# Patient Record
Sex: Male | Born: 1995 | Hispanic: No | Marital: Single | Smoking: Never smoker
Health system: Southern US, Community
[De-identification: ages and names within clinical notes are randomized; demographics above are authoritative.]

## PROBLEM LIST (undated history)

## (undated) HISTORY — PX: NO PAST SURGERIES: SHX2092

---
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
## 2016-11-02 ENCOUNTER — Encounter: Payer: Self-pay | Admitting: Podiatry

## 2016-11-02 ENCOUNTER — Ambulatory Visit (INDEPENDENT_AMBULATORY_CARE_PROVIDER_SITE_OTHER): Payer: BLUE CROSS/BLUE SHIELD | Admitting: Podiatry

## 2016-11-02 VITALS — BP 103/72 | HR 67 | Resp 18

## 2016-11-02 DIAGNOSIS — L601 Onycholysis: Secondary | ICD-10-CM | POA: Diagnosis not present

## 2016-11-02 DIAGNOSIS — L603 Nail dystrophy: Secondary | ICD-10-CM | POA: Diagnosis not present

## 2016-11-02 DIAGNOSIS — B351 Tinea unguium: Secondary | ICD-10-CM

## 2016-11-02 NOTE — Progress Notes (Signed)
   Subjective:    Patient ID: Hector Wilson, male    DOB: 25-Apr-1995, 21 y.o.   MRN: [id number]  HPI  21 year old male presents the also with his mom for concerns of his left big toenail becoming thick and discolored. He states he play soccer in the toenail did come off in one point but as the nail came and is started to become loose as well as discolored. Denies any pain in the nail denies any surrounding redness or drainage. He's had a recent treatment for this. He has no other concerns.  Review of Systems  Skin:       Change in nails   All other systems reviewed and are negative.      Objective:   Physical Exam General: AAO x3, NAD  Dermatological: The left hallux toenails dystrophic, discolored yellow to brown discoloration is loose from the underlying nailbed distally. There is no surrounding edema, erythema, drainage or pus there is no clinical signs of infection present today. There is no open lesions. There is no significant discoloration to the other toenails.  Vascular: Dorsalis Pedis artery and Posterior Tibial artery pedal pulses are 2/4 bilateral with immedate capillary fill time. There is no pain with calf compression, swelling, warmth, erythema.   Neruologic: Grossly intact via light touch bilateral. Vibratory intact via tuning fork bilateral. Protective threshold with Semmes Wienstein monofilament intact to all pedal sites bilateral.   Musculoskeletal: No gross boney pedal deformities bilateral. No pain, crepitus, or limitation noted with foot and ankle range of motion bilateral. Muscular strength 5/5 in all groups tested bilateral.  Gait: Unassisted, Nonantalgic.     Assessment & Plan:  21 year old male with left hallux onychodystrophy likely onychomycosis -Treatment options discussed including all alternatives, risks, and complications -Etiology of symptoms were discussed -At this point discussed likely partially due to nail fungus as well as nail damage. I did  debridement toenail tenderness of this for culture and pathology symptoms to [facility] labs. If fungus we'll likely do oral Lamisil. He does live in [place] is going to school down there and I'll call him with results once I get up back. His phone number is [phone].  Ovid Curd, DPM

## 2016-11-02 NOTE — Patient Instructions (Signed)

## 2016-11-10 ENCOUNTER — Telehealth: Payer: Self-pay | Admitting: Podiatry

## 2016-11-10 NOTE — Telephone Encounter (Signed)
This patient's mother was here interpreting for another patient when she received a phone call from Marylene Landngela that her son needed to make an appointment to follow up with you. She said that her son would be unable to make an appointment since he goes to college in [place]. Is there any way his mother could receive the results and then proceed with medication for her son? Please advise.

## 2016-11-10 NOTE — Telephone Encounter (Signed)
Left vm for pt to call to schedule appt

## 2016-11-14 ENCOUNTER — Encounter: Payer: Self-pay | Admitting: Podiatry

## 2016-11-14 ENCOUNTER — Ambulatory Visit (INDEPENDENT_AMBULATORY_CARE_PROVIDER_SITE_OTHER): Payer: BLUE CROSS/BLUE SHIELD | Admitting: Podiatry

## 2016-11-14 DIAGNOSIS — Z79899 Other long term (current) drug therapy: Secondary | ICD-10-CM | POA: Diagnosis not present

## 2016-11-14 DIAGNOSIS — B351 Tinea unguium: Secondary | ICD-10-CM

## 2016-11-14 MED ORDER — TERBINAFINE HCL 250 MG PO TABS: 250.0000 mg | ORAL_TABLET | Freq: Every day | ORAL | 0 refills | Status: DC

## 2016-11-14 MED FILL — TERBINAFINE HCL 250 MG TAB: 250 | 90 days supply | Qty: 90 | Fill #0

## 2016-11-14 NOTE — Progress Notes (Addendum)
Subjective: 21 year old male presents the office today discussed nail culture results. He's had a left big toenail which is becoming thick and discolored. His been damaged complaints soccer. He presents today for follow-up. Denies any swelling or redness or any pain or drainage to the toenail. Is no changes since last appointment Denies any systemic complaints such as fevers, chills, nausea, vomiting. No acute changes since last appointment, and no other complaints at this time.   Objective: AAO x3, NAD DP/PT pulses palpable bilaterally, CRT less than 3 seconds Overall his exam is unchanged. Left hallux is a dystrophic, discolored yellow to brown discoloration and somewhat attached from the nailbed distally. Is firmly adhered on the proximal aspect. There is no pain to the nail redness or any redness or drainage. There is no clinical signs of infection present.  No open lesions or pre-ulcerative lesions.  No pain with calf compression, swelling, warmth, erythema  Assessment: Onychomycosis.  Plan: -All treatment options discussed with the patient including all alternatives, risks, complications.  -Discussed nail culture results which were positive for onychomycosis, fusarium species. We discussed treatment options at this point we'll proceed with oral therapy. Prescribed Lamisil. Also prior to starting the medication 90 blood work today including CBC and LFTs he is not to start the medication until I call him with results of the blood work and he verbally understood this. Discussed with him to hold off on alcohol and difficult to get the medication. He has no other risk factors he is aware of. -Discussed with him the distal component likely of nail damage the nail may never look normal. -RTC 3 months or sooner if needed. -Patient encouraged to call the office with any questions, concerns, change in symptoms.   Ovid CurdMatthew Wagoner, DPM

## 2016-11-14 NOTE — Patient Instructions (Addendum)
If you have any questions, please feel free to give [place] a call. Good luck at [facility] this year!  ----------------  Terbinafine oral granules What is this medicine? TERBINAFINE (TER bin a feen) is an antifungal medicine. It is used to treat certain kinds of fungal or yeast infections. This medicine may be used for other purposes; ask your health care provider or pharmacist if you have questions. COMMON BRAND NAME(S): Lamisil What should I tell my health care provider before I take this medicine? They need to know if you have any of these conditions: -drink alcoholic beverages -kidney disease -liver disease -an unusual or allergic reaction to Terbinafine, other medicines, foods, dyes, or preservatives -pregnant or trying to get pregnant -breast-feeding How should I use this medicine? Take this medicine by mouth. Follow the directions on the prescription label. Hold packet with cut line on top. Shake packet gently to settle contents. Tear packet open along cut line, or use scissors to cut across line. Carefully pour the entire contents of packet onto a spoonful of a soft food, such as pudding or other soft, non-acidic food such as mashed potatoes (do NOT use applesauce or a fruit-based food). If two packets are required for each dose, you may either sprinkle the content of both packets on one spoonful of non-acidic food, or sprinkle the contents of both packets on two spoonfuls of non-acidic food. Make sure that no granules remain in the packet. Swallow the mxiture of the food and granules without chewing. Take your medicine at regular intervals. Do not take it more often than directed. Take all of your medicine as directed even if you think you are better. Do not skip doses or stop your medicine early. Contact your pediatrician or health care professional regarding the use of this medicine in children. While this medicine may be prescribed for children as young as 4 years for selected conditions,  precautions do apply. Overdosage: If you think you have taken too much of this medicine contact a poison control center or emergency room at once. NOTE: This medicine is only for you. Do not share this medicine with others. What if I miss a dose? If you miss a dose, take it as soon as you can. If it is almost time for your next dose, take only that dose. Do not take double or extra doses. What may interact with this medicine? Do not take this medicine with any of the following medications: -thioridazine This medicine may also interact with the following medications: -beta-blockers -caffeine -cimetidine -cyclosporine -MAOIs like Carbex, Eldepryl, Marplan, Nardil, and Parnate -medicines for fungal infections like fluconazole and ketoconazole -medicines for irregular heartbeat like amiodarone, flecainide and propafenone -rifampin -SSRIs like citalopram, escitalopram, fluoxetine, fluvoxamine, paroxetine and sertraline -tricyclic antidepressants like amitriptyline, clomipramine, desipramine, imipramine, nortriptyline, and others -warfarin This list may not describe all possible interactions. Give your health care provider a list of all the medicines, herbs, non-prescription drugs, or dietary supplements you use. Also tell them if you smoke, drink alcohol, or use illegal drugs. Some items may interact with your medicine. What should I watch for while using this medicine? Your doctor may monitor your liver function. Tell your doctor right away if you have nausea or vomiting, loss of appetite, stomach pain on your right upper side, yellow skin, dark urine, light stools, or are over tired. You need to take this medicine for 6 weeks or longer to cure the fungal infection. Take your medicine regularly for as long as your doctor or  health care professional tells you to. What side effects may I notice from receiving this medicine? Side effects that you should report to your doctor or health care  professional as soon as possible: -allergic reactions like skin rash or hives, swelling of the face, lips, or tongue -change in vision -dark urine -fever or infection -general ill feeling or flu-like symptoms -light-colored stools -loss of appetite, nausea -redness, blistering, peeling or loosening of the skin, including inside the mouth -right upper belly pain -unusually weak or tired -yellowing of the eyes or skin Side effects that usually do not require medical attention (report to your doctor or health care professional if they continue or are bothersome): -changes in taste -diarrhea -hair loss -muscle or joint pain -stomach upset This list may not describe all possible side effects. Call your doctor for medical advice about side effects. You may report side effects to FDA at 1-800-FDA-1088. Where should I keep my medicine? Keep out of the reach of children. Store at room temperature between 15 and 30 degrees C (59 and 86 degrees F). Throw away any unused medicine after the expiration date. NOTE: This sheet is a summary. It may not cover all possible information. If you have questions about this medicine, talk to your doctor, pharmacist, or health care provider.  2018 Elsevier/Gold Standard (2007-05-10 17:25:48)

## 2016-11-15 ENCOUNTER — Telehealth: Payer: Self-pay | Admitting: *Deleted

## 2016-11-15 LAB — HEPATIC FUNCTION PANEL
ALK PHOS: 75 IU/L (ref 39–117)
ALT: 21 IU/L (ref 0–44)
AST: 17 IU/L (ref 0–40)
Albumin: 5 g/dL (ref 3.5–5.5)
Bilirubin Total: 0.8 mg/dL (ref 0.0–1.2)
Bilirubin, Direct: 0.17 mg/dL (ref 0.00–0.40)
Total Protein: 6.9 g/dL (ref 6.0–8.5)

## 2016-11-15 LAB — CBC WITH DIFFERENTIAL/PLATELET
Basophils Absolute: 0 10*3/uL (ref 0.0–0.2)
Basos: 1 %
EOS (ABSOLUTE): 0.1 10*3/uL (ref 0.0–0.4)
EOS: 2 %
HEMATOCRIT: 43.1 % (ref 37.5–51.0)
Hemoglobin: 15.2 g/dL (ref 13.0–17.7)
Immature Grans (Abs): 0 10*3/uL (ref 0.0–0.1)
Immature Granulocytes: 0 %
LYMPHS ABS: 1.3 10*3/uL (ref 0.7–3.1)
Lymphs: 28 %
MCH: 30.2 pg (ref 26.6–33.0)
MCHC: 35.3 g/dL (ref 31.5–35.7)
MCV: 86 fL (ref 79–97)
MONOCYTES: 5 %
MONOS ABS: 0.2 10*3/uL (ref 0.1–0.9)
NEUTROS ABS: 3.1 10*3/uL (ref 1.4–7.0)
Neutrophils: 64 %
Platelets: 220 10*3/uL (ref 150–379)
RBC: 5.03 x10E6/uL (ref 4.14–5.80)
RDW: 12.8 % (ref 12.3–15.4)
WBC: 4.8 10*3/uL (ref 3.4–10.8)

## 2016-11-15 NOTE — Progress Notes (Signed)
Called patient and relayed the message per Dr Wagoner. Clinton Dragone 

## 2016-11-15 NOTE — Telephone Encounter (Signed)
Called patient and stated that per Dr Ardelle AntonWagoner the blood work was normal and that he could begin the lamisil and to call if any concerns or questions.Misty StanleyLisa

## 2016-12-18 ENCOUNTER — Other Ambulatory Visit: Payer: Self-pay | Admitting: Podiatry

## 2016-12-18 DIAGNOSIS — Z79899 Other long term (current) drug therapy: Secondary | ICD-10-CM

## 2016-12-18 NOTE — Progress Notes (Signed)
Patients mother came in to have her sons blood work rechecked. Ordered CBC and LFT

## 2017-01-02 DIAGNOSIS — R21 Rash and other nonspecific skin eruption: Secondary | ICD-10-CM | POA: Diagnosis not present

## 2017-01-18 ENCOUNTER — Other Ambulatory Visit: Payer: Self-pay | Admitting: Podiatry

## 2017-01-19 ENCOUNTER — Other Ambulatory Visit: Payer: BLUE CROSS/BLUE SHIELD

## 2017-01-24 LAB — CBC WITH DIFFERENTIAL/PLATELET
Basophils Absolute: 0 10*3/uL (ref 0.0–0.2)
Basos: 1 %
EOS (ABSOLUTE): 0.1 10*3/uL (ref 0.0–0.4)
EOS: 2 %
HEMATOCRIT: 44.2 % (ref 37.5–51.0)
Hemoglobin: 15.3 g/dL (ref 13.0–17.7)
IMMATURE GRANS (ABS): 0 10*3/uL (ref 0.0–0.1)
Immature Granulocytes: 0 %
LYMPHS ABS: 1.8 10*3/uL (ref 0.7–3.1)
LYMPHS: 35 %
MCH: 29.8 pg (ref 26.6–33.0)
MCHC: 34.6 g/dL (ref 31.5–35.7)
MCV: 86 fL (ref 79–97)
MONOCYTES: 5 %
Monocytes Absolute: 0.3 10*3/uL (ref 0.1–0.9)
Neutrophils Absolute: 3 10*3/uL (ref 1.4–7.0)
Neutrophils: 57 %
Platelets: 226 10*3/uL (ref 150–379)
RBC: 5.13 x10E6/uL (ref 4.14–5.80)
RDW: 12.4 % (ref 12.3–15.4)
WBC: 5.2 10*3/uL (ref 3.4–10.8)

## 2017-01-24 LAB — HEPATIC FUNCTION PANEL
ALBUMIN: 5.2 g/dL (ref 3.5–5.5)
ALK PHOS: 72 IU/L (ref 39–117)
ALT: 38 IU/L (ref 0–44)
AST: 31 IU/L (ref 0–40)
BILIRUBIN TOTAL: 0.7 mg/dL (ref 0.0–1.2)
BILIRUBIN, DIRECT: 0.2 mg/dL (ref 0.00–0.40)
TOTAL PROTEIN: 7.1 g/dL (ref 6.0–8.5)

## 2017-01-25 ENCOUNTER — Telehealth: Payer: Self-pay | Admitting: *Deleted

## 2017-01-25 NOTE — Telephone Encounter (Signed)
I spoke with pt's mtr, Ana and asked to speak with pt to give results. Darien Ramusna states they signed paperwork for her to get his information. I reviewed 11/14/2016 Release of Information, does have [employer] designated. I informed Ana of Dr. Gabriel RungWagoner's review of results and orders.

## 2017-01-25 NOTE — Telephone Encounter (Signed)
-----   Message from Vivi BarrackMatthew R Wagoner, DPM sent at 01/24/2017  5:15 PM EST ----- Labs normal- please let him know. Thanks.

## 2017-02-23 ENCOUNTER — Ambulatory Visit (INDEPENDENT_AMBULATORY_CARE_PROVIDER_SITE_OTHER): Payer: BLUE CROSS/BLUE SHIELD | Admitting: Internal Medicine

## 2017-02-23 ENCOUNTER — Encounter: Payer: Self-pay | Admitting: Internal Medicine

## 2017-02-23 ENCOUNTER — Encounter: Payer: BLUE CROSS/BLUE SHIELD | Admitting: Internal Medicine

## 2017-02-23 VITALS — BP 116/72 | HR 69 | Temp 98.2°F | Resp 14 | Ht 71.0 in | Wt 144.4 lb

## 2017-02-23 DIAGNOSIS — Z0000 Encounter for general adult medical examination without abnormal findings: Secondary | ICD-10-CM

## 2017-02-23 NOTE — Patient Instructions (Signed)
GO TO THE LAB : Get the blood work     GO TO THE FRONT DESK Schedule your next appointment for a   physical exam in 1 year  Think about BEXERO     Safe Sex Practicing safe sex means taking steps before and during sex to reduce your risk of:  Getting an STD (sexually transmitted disease).  Giving your partner an STD.  Unwanted pregnancy.  How can I practice safe sex?  To practice safe sex:  Limit your sexual partners to only one partner who is having sex with only you.  Avoid using alcohol and recreational drugs before having sex. These substances can affect your judgment.  Before having sex with a new partner: ? Talk to your partner about past partners, past STDs, and drug use. ? You and your partner should be screened for STDs and discuss the results with each other.  Check your body regularly for sores, blisters, rashes, or unusual discharge. If you notice any of these problems, visit your health care provider.  If you have symptoms of an infection or you are being treated for an STD, avoid sexual contact.  While having sex, use a condom. Make sure to: ? Use a condom every time you have vaginal, oral, or anal sex. Both females and males should wear condoms during oral sex. ? Keep condoms in place from the beginning to the end of sexual activity. ? Use a latex condom, if possible. Latex condoms offer the best protection. ? Use only water-based lubricants or oils to lubricate a condom. Using petroleum-based lubricants or oils will weaken the condom and increase the chance that it will break.  See your health care provider for regular screenings, exams, and tests for STDs.  Talk with your health care provider about the form of birth control (contraception) that is best for you.  Get vaccinated against hepatitis B and human papillomavirus (HPV).  If you are at risk of being infected with HIV (human immunodeficiency virus), talk with your health care provider about taking  a prescription medicine to prevent HIV infection. You are considered at risk for HIV if: ? You are a man who has sex with other men. ? You are a heterosexual man or woman who is sexually active with more than one partner. ? You take drugs by injection. ? You are sexually active with a partner who has HIV.  This information is not intended to replace advice given to you by your health care provider. Make sure you discuss any questions you have with your health care provider. Document Released: 04/06/2004 Document Revised: 07/14/2015 Document Reviewed: 01/17/2015 Elsevier Interactive Patient Education  2018 [employer]     Testicular Self-Exam A self-examination of your testicles (testicular self-exam) involves looking at and feeling your testicles for abnormal lumps or swelling. Several things can cause swelling, lumps, or pain in your testicles. Some of these causes are:  Injuries.  Inflammation.  Infection.  Buildup of fluids around your testicle (hydrocele).  Twisted testicles (testicular torsion).  Testicular cancer.  Why is it important to do a testicular self-exam? Self-examination of the testicles and the left and right groin areas may be recommended if you are at risk for testicular cancer. Your groin is where your lower abdomen meets your upper thighs. You may be at risk for testicular cancer if you have:  An undescended testicle (cryptorchidism).  A history of previous testicular cancer.  A family history of testicular cancer.  How to do a testicular  self-exam The testicles are easiest to examine after a warm bath or shower. They are more difficult to examine when you are cold. This is because the muscles attached to the testicles retract and pull them up higher or into the abdomen. A normal testicle is egg-shaped and feels firm. It is smooth and not tender. The spermatic cord can be felt as a firm, spaghetti-like cord at the back of your testicle. Look and feel  for changes  Stand and hold your penis away from your body.  Look at each testicle to check for lumps or swelling.  Roll each testicle between your thumb and forefinger, feeling the entire testicle. Feel for: ? Lumps. ? Swelling. ? Discomfort.  Check the groin area between your abdomen and upper thighs on both sides of your body. Look and feel for any swelling or bumps that are tender. These could be enlarged lymph nodes. Contact a health care provider if:  You find any bumps or lumps, such as a small, hard, pea-sized lump.  You find swelling, pain, or soreness.  You see or feel any other changes in your testicles. Summary  A self-examination of your testicles (testicular self-exam) involves looking at and feeling your testicles for any changes.  Self-examination of the testicles and the left and right groin areas may be recommended if you are at risk for testicular cancer.  You should check each of your testicles for lumps, swelling, or discomfort.  You should check for swelling or tender bumps in your groin area between your lower abdomen and upper thighs. This information is not intended to replace advice given to you by your health care provider. Make sure you discuss any questions you have with your health care provider. Document Released: 06/05/2000 Document Revised: 01/24/2016 Document Reviewed: 01/24/2016 Elsevier Interactive Patient Education  2017 [employer]

## 2017-02-23 NOTE — Progress Notes (Signed)
   Subjective:    Patient ID: Hector Wilson, male    DOB: 10/18/1995, 21 y.o.   MRN: [id number]  DOS:  02/23/2017 Type of visit - description : cpx Interval history: Doing well, just finished his finals in college, [place] in [place].  No major concerns.   Review of Systems  A 14 point review of systems is negative   History reviewed. No pertinent past medical history.  Past Surgical History:  Procedure Laterality Date  . NO PAST SURGERIES      Social History   Socioeconomic History  . Marital status: Single    Spouse name: Not on file  . Number of children: 0  . Years of education: Not on file  . Highest education level: Not on file  Social Needs  . Financial resource strain: Not on file  . Food insecurity - worry: Not on file  . Food insecurity - inability: Not on file  . Transportation needs - medical: Not on file  . Transportation needs - non-medical: Not on file  Occupational History  . Occupation: Consulting civil engineerstudent   Tobacco Use  . Smoking status: Never Smoker  . Smokeless tobacco: Never Used  Substance and Sexual Activity  . Alcohol use: Yes    Alcohol/week: 0.0 oz    Comment: socially   . Drug use: No  . Sexual activity: Not on file  Other Topics Concern  . 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 Breast cancer Other        GM  . CAD Neg Hx   . Diabetes Neg Hx   . Colon cancer Neg Hx      Allergies as of 02/23/2017   No Known Allergies     Medication List    as of 02/23/2017 11:59 PM   You have not been prescribed any medications.        Objective:   Physical Exam BP 116/72 (BP Location: Left Arm, Patient Position: Sitting, Cuff Size: Small)   Pulse 69   Temp 98.2 F (36.8 C) (Oral)   Resp 14   Ht 5\' 11"  (1.803 m)   Wt 144 lb 6 oz (65.5 kg)   SpO2 99%   BMI 20.14 kg/m  General:   Well developed, well nourished . NAD.  Neck: No   thyromegaly  HEENT:  Normocephalic . Face symmetric, atraumatic Lungs:  CTA B Normal respiratory effort, no intercostal retractions, no accessory muscle use. Heart: RRR,  no murmur.  No pretibial edema bilaterally  Abdomen:  Not distended, soft, non-tender. No rebound or rigidity.   Skin: Exposed areas without rash. Not pale. Not jaundice Neurologic:  alert & oriented X3.  Speech normal, gait appropriate for age and unassisted Strength symmetric and appropriate for age.  Psych: Cognition and judgment appear intact.  Cooperative with normal attention span and concentration.  Behavior appropriate. No anxious or depressed appearing.     Assessment & Plan:

## 2017-02-23 NOTE — Progress Notes (Signed)
Pre visit review using our clinic review tool, if applicable. No additional management support is needed unless otherwise documented below in the visit note. 

## 2017-02-23 NOTE — Assessment & Plan Note (Addendum)
-  Immunizations:    up-to-date, againre Bexsero  Declining a flu shot - Labs : Reviewed, will get a BMP and TSH -Counseled: Diet exercise;  STE.  Risk of drugs, tobacco, unsafe sex, unsafe driving -Just finish treatment with Lamisil, follow-up LFTs were normal. RTC 1 year

## 2017-02-24 LAB — TSH: TSH: 1.33 m[IU]/L (ref 0.40–4.50)

## 2017-02-24 LAB — BASIC METABOLIC PANEL
BUN: 13 mg/dL (ref 7–25)
CALCIUM: 9.5 mg/dL (ref 8.6–10.3)
CO2: 28 mmol/L (ref 20–32)
Chloride: 102 mmol/L (ref 98–110)
Creat: 0.96 mg/dL (ref 0.60–1.35)
GLUCOSE: 98 mg/dL (ref 65–99)
POTASSIUM: 4.1 mmol/L (ref 3.5–5.3)
SODIUM: 141 mmol/L (ref 135–146)

## 2019-02-26 DIAGNOSIS — Z03818 Encounter for observation for suspected exposure to other biological agents ruled out: Secondary | ICD-10-CM | POA: Diagnosis not present
